# Patient Record
Sex: Female | Born: 1980 | Race: White | Hispanic: No | Marital: Single | Smoking: Current every day smoker
Health system: Southern US, Community
[De-identification: ages and names within clinical notes are randomized; demographics above are authoritative.]

---

## 2004-10-22 ENCOUNTER — Emergency Department (HOSPITAL_COMMUNITY): Admission: EM | Admit: 2004-10-22 | Discharge: 2004-10-22 | Payer: Self-pay | Admitting: Emergency Medicine

## 2012-11-25 ENCOUNTER — Emergency Department (HOSPITAL_COMMUNITY)
Admission: EM | Admit: 2012-11-25 | Discharge: 2012-11-25 | Disposition: A | Discharge status: Home / Self Care | Payer: Self-pay | Attending: Emergency Medicine | Admitting: Emergency Medicine

## 2012-11-25 ENCOUNTER — Encounter (HOSPITAL_COMMUNITY): Payer: Self-pay | Admitting: *Deleted

## 2012-11-25 DIAGNOSIS — Z3202 Encounter for pregnancy test, result negative: Secondary | ICD-10-CM | POA: Insufficient documentation

## 2012-11-25 DIAGNOSIS — F17200 Nicotine dependence, unspecified, uncomplicated: Secondary | ICD-10-CM | POA: Insufficient documentation

## 2012-11-25 DIAGNOSIS — R1084 Generalized abdominal pain: Secondary | ICD-10-CM | POA: Insufficient documentation

## 2012-11-25 DIAGNOSIS — R109 Unspecified abdominal pain: Secondary | ICD-10-CM

## 2012-11-25 DIAGNOSIS — K2970 Gastritis, unspecified, without bleeding: Secondary | ICD-10-CM | POA: Insufficient documentation

## 2012-11-25 LAB — CBC WITH DIFFERENTIAL/PLATELET
Eosinophils Absolute: 0.1 10*3/uL (ref 0.0–0.7)
Hemoglobin: 14.8 g/dL (ref 12.0–15.0)
Lymphs Abs: 3.2 10*3/uL (ref 0.7–4.0)
MCH: 31 pg (ref 26.0–34.0)
Monocytes Relative: 6 % (ref 3–12)
Neutro Abs: 8.6 10*3/uL — ABNORMAL HIGH (ref 1.7–7.7)
Neutrophils Relative %: 68 % (ref 43–77)
RBC: 4.78 MIL/uL (ref 3.87–5.11)

## 2012-11-25 LAB — URINE MICROSCOPIC-ADD ON

## 2012-11-25 LAB — URINALYSIS, ROUTINE W REFLEX MICROSCOPIC
Glucose, UA: NEGATIVE mg/dL
Hgb urine dipstick: NEGATIVE
Protein, ur: NEGATIVE mg/dL
Specific Gravity, Urine: 1.024 (ref 1.005–1.030)
pH: 5.5 (ref 5.0–8.0)

## 2012-11-25 LAB — COMPREHENSIVE METABOLIC PANEL
ALT: 27 U/L (ref 0–35)
AST: 20 U/L (ref 0–37)
BUN: 13 mg/dL (ref 6–23)
Chloride: 102 mEq/L (ref 96–112)
GFR calc non Af Amer: >90 mL/min (ref >90)
Glucose, Bld: 122 mg/dL — ABNORMAL HIGH (ref 70–99)
Potassium: 3.7 mEq/L (ref 3.5–5.1)

## 2012-11-25 LAB — POCT PREGNANCY, URINE: Preg Test, Ur: NEGATIVE

## 2012-11-25 MED ORDER — RANITIDINE HCL 150 MG PO TABS: 150.0000 mg | ORAL_TABLET | Freq: Two times a day (BID) | ORAL | Status: AC

## 2012-11-25 MED ORDER — PANTOPRAZOLE SODIUM 40 MG PO TBEC
40.0000 mg | DELAYED_RELEASE_TABLET | Freq: Once | ORAL | Status: AC
  Administered 2012-11-25: 40 mg via ORAL
  Filled 2012-11-25: qty 1

## 2012-11-25 MED ORDER — GI COCKTAIL ~~LOC~~
30.0000 mL | Freq: Once | ORAL | Status: AC
  Administered 2012-11-25: 30 mL via ORAL
  Filled 2012-11-25: qty 30

## 2012-11-25 NOTE — ED Provider Notes (Signed)
I saw and evaluated the patient, reviewed the resident's note and I agree with the findings and plan.  This 32 year old female has 2 months of daily upper abdominal pains migratory sometimes right upper quadrant sometimes left upper quadrant sometimes epigastric sometimes the entire upper abdomen and never in the lower abdomen with no vomiting no diarrhea no other associated symptoms and no relationship to exertion activity or meals with a soft nontender abdomen today. Her pains can last 24 hours a day or several hours at a time and she has tried no over-the-counter antacids or any other treatment for this the last 2 months.  Hurman Horn, MD 11/28/12 [id number]

## 2012-11-25 NOTE — ED Provider Notes (Signed)
History     CSN: [id number]  Arrival date & time 11/25/12  1522   First MD Initiated Contact with Patient 11/25/12 1549      Chief Complaint  Patient presents with  . Abdominal Cramping    (Consider location/radiation/quality/duration/timing/severity/associated sxs/prior treatment) Patient is a 32 y.o. female presenting with cramps. The history is provided by the patient.  Abdominal Cramping This is a new problem. The current episode started more than 1 month ago (2 months ago). The problem occurs intermittently. The problem has been unchanged. Associated symptoms include abdominal pain (generalized upper abdominal cramping). Pertinent negatives include no anorexia, chest pain, chills, coughing, fever, nausea, neck pain, numbness, rash, urinary symptoms or vomiting. She has tried nothing for the symptoms.    History reviewed. No pertinent past medical history.  History reviewed. No pertinent past surgical history.  No family history on file.  History  Substance Use Topics  . Smoking status: Current Every Day Smoker -- 1.00 packs/day    Types: Cigarettes  . Smokeless tobacco: Not on file  . Alcohol Use: Yes     Comment: occasionally    OB History   Grav Para Term Preterm Abortions TAB SAB Ect Mult Living                  Review of Systems  Constitutional: Negative for fever, chills, activity change and appetite change.  HENT: Negative for neck pain and neck stiffness.   Respiratory: Negative for cough, chest tightness, shortness of breath and wheezing.   Cardiovascular: Negative for chest pain and palpitations.  Gastrointestinal: Positive for abdominal pain (generalized upper abdominal cramping). Negative for nausea, vomiting, diarrhea, constipation, blood in stool, abdominal distention, anal bleeding, rectal pain and anorexia.  Genitourinary: Negative for dysuria, frequency, hematuria, flank pain, decreased urine volume, vaginal bleeding, vaginal discharge, difficulty  urinating, vaginal pain, menstrual problem and pelvic pain.  Skin: Negative for rash and wound.  Neurological: Negative for seizures, syncope, facial asymmetry, light-headedness and numbness.  Psychiatric/Behavioral: Negative for confusion and agitation.  All other systems reviewed and are negative.    Allergies  Review of patient's allergies indicates no known allergies.  Home Medications  No current outpatient prescriptions on file.  BP 158/100  Pulse 103  Temp(Src) 98 F (36.7 C) (Oral)  Resp 19  SpO2 97%  LMP 08/14/2012  Physical Exam  Nursing note and vitals reviewed. Constitutional: She is oriented to person, place, and time. She appears well-developed and well-nourished.  HENT:  Head: Normocephalic and atraumatic.  Right Ear: External ear normal.  Left Ear: External ear normal.  Nose: Nose normal.  Mouth/Throat: Oropharynx is clear and moist. No oropharyngeal exudate.  Eyes: Conjunctivae are normal. Pupils are equal, round, and reactive to light.  Neck: Normal range of motion. Neck supple.  Cardiovascular: Normal rate, regular rhythm, normal heart sounds and intact distal pulses.  Exam reveals no gallop and no friction rub.   No murmur heard. Pulmonary/Chest: Effort normal and breath sounds normal. No respiratory distress. She has no wheezes. She has no rales. She exhibits no tenderness.  Abdominal: Soft. Bowel sounds are normal. She exhibits no distension and no mass. There is no tenderness. There is no rebound and no guarding.  Musculoskeletal: Normal range of motion. She exhibits no edema and no tenderness.  Neurological: She is alert and oriented to person, place, and time.  Skin: Skin is warm and dry.  Psychiatric: She has a normal mood and affect. Her behavior is normal. Judgment and thought  content normal.    ED Course  Procedures (including critical care time)  Labs Reviewed  CBC WITH DIFFERENTIAL - Abnormal; Notable for the following:    WBC 12.7 (*)     Neutro Abs 8.6 (*)    All other components within normal limits  COMPREHENSIVE METABOLIC PANEL - Abnormal; Notable for the following:    Glucose, Bld 122 (*)    All other components within normal limits  URINALYSIS, ROUTINE W REFLEX MICROSCOPIC - Abnormal; Notable for the following:    APPearance CLOUDY (*)    Leukocytes, UA SMALL (*)    All other components within normal limits  URINE MICROSCOPIC-ADD ON - Abnormal; Notable for the following:    Squamous Epithelial / LPF MANY (*)    All other components within normal limits  LIPASE, BLOOD  POCT PREGNANCY, URINE   No results found.   1. Abdominal cramping   2. Gastritis       MDM  32 yo F presents for 2 months of waxing and waning upper bilateral abdominal cramping without associated nausea/vomiting, fever/chills, or changes in bowel movements. Triage note mentions "menstrual cramping," and pt states the quality of pain is similar to menstrual cramping; however, all symptoms have been above umbilicus. No pain below umbilicus. AFVSS. No abdominal tenderness; no flank tenderness. Clinical picture not concerning for acute appendicitis, ovarian torsion, AAA, nephrolithiasis, or ectopic pregnancy. U/A not c/w UTI. Urine hcG negative. Clinical picture not concerning for acute cholecystitis, acute hepatitis, perforated viscus, or acute pancreatitis. Pain resolved with GI cocktail and Zantac. Pt counseled to obtain a PCP (and instructions on how to obtain a PCP) and provided with prescription for Zantac.         Clemetine Marker, MD 11/25/12 1901

## 2012-11-25 NOTE — ED Notes (Addendum)
Pt last LMP in Jan.  States she has not been having menstrual bleeding, but continues to feel menstrual cramping that is not relieved by otc meds.  Pt has taken 3 pregnancy tests in last 3 months that were all negative.  Denies vaginal discharge or urinary/bowel s/s.

## 2015-05-17 ENCOUNTER — Encounter (HOSPITAL_COMMUNITY): Payer: Self-pay | Admitting: Emergency Medicine

## 2015-05-17 ENCOUNTER — Emergency Department (HOSPITAL_COMMUNITY)
Admission: EM | Admit: 2015-05-17 | Discharge: 2015-05-17 | Disposition: A | Discharge status: Home / Self Care | Payer: BC Managed Care – PPO | Attending: Emergency Medicine | Admitting: Emergency Medicine

## 2015-05-17 DIAGNOSIS — Z79899 Other long term (current) drug therapy: Secondary | ICD-10-CM | POA: Diagnosis not present

## 2015-05-17 DIAGNOSIS — L02211 Cutaneous abscess of abdominal wall: Secondary | ICD-10-CM

## 2015-05-17 DIAGNOSIS — Z720 Tobacco use: Secondary | ICD-10-CM | POA: Insufficient documentation

## 2015-05-17 MED ORDER — OXYCODONE-ACETAMINOPHEN 5-325 MG PO TABS
1.0000 | ORAL_TABLET | Freq: Once | ORAL | Status: AC
  Administered 2015-05-17: 1 via ORAL
  Filled 2015-05-17: qty 1

## 2015-05-17 MED ORDER — SULFAMETHOXAZOLE-TRIMETHOPRIM 800-160 MG PO TABS
1.0000 | ORAL_TABLET | Freq: Once | ORAL | Status: AC
  Administered 2015-05-17: 1 via ORAL
  Filled 2015-05-17: qty 1

## 2015-05-17 MED ORDER — OXYCODONE-ACETAMINOPHEN 5-325 MG PO TABS
1.0000 | ORAL_TABLET | ORAL | Status: AC | PRN
Start: 2015-05-17 — End: ?

## 2015-05-17 MED ORDER — SULFAMETHOXAZOLE-TRIMETHOPRIM 800-160 MG PO TABS: 1.0000 | ORAL_TABLET | Freq: Two times a day (BID) | ORAL | Status: AC

## 2015-05-17 MED ORDER — LIDOCAINE-EPINEPHRINE (PF) 2 %-1:200000 IJ SOLN
20.0000 mL | Freq: Once | INTRAMUSCULAR | Status: AC
  Administered 2015-05-17: 20 mL via INTRADERMAL
  Filled 2015-05-17: qty 20

## 2015-05-17 NOTE — ED Notes (Signed)
Pt c/o large abscess on lower abd x's 4 days .

## 2015-05-17 NOTE — ED Provider Notes (Signed)
CSN: [id number]     Arrival date & time 05/17/15  1919 History   First MD Initiated Contact with Patient 05/17/15 2115     Chief Complaint  Patient presents with  . Abscess     (Consider location/radiation/quality/duration/timing/severity/associated sxs/prior Treatment) Patient is a 34 y.o. female presenting with abscess. The history is provided by the patient. No language interpreter was used.  Abscess Location:  Torso Abscess quality: draining   Red streaking: yes   Duration:  4 days Progression:  Worsening Associated symptoms: no fever, no nausea and no vomiting   Associated symptoms comment:  Complains of large area of redness surrounding abscess on pannus. No fever, vomiting. No h/o DM or recurrent abscesses.    History reviewed. No pertinent past medical history. History reviewed. No pertinent past surgical history. No family history on file. Social History  Substance Use Topics  . Smoking status: Current Every Day Smoker -- 1.00 packs/day    Types: Cigarettes  . Smokeless tobacco: None  . Alcohol Use: Yes     Comment: occasionally   OB History    No data available     Review of Systems  Constitutional: Negative for fever and chills.  Gastrointestinal: Negative.  Negative for nausea and vomiting.  Musculoskeletal: Negative.  Negative for myalgias.  Skin:       C/O Abscess.  Neurological: Negative.       Allergies  Review of patient's allergies indicates no known allergies.  Home Medications   Prior to Admission medications   Medication Sig Start Date End Date Taking? Authorizing Provider  ranitidine (ZANTAC) 150 MG tablet Take 1 tablet (150 mg total) by mouth 2 (two) times daily. 11/25/12   Clemetine Marker, MD   BP 117/74 mmHg  Pulse 111  Temp(Src) 98.6 F (37 C) (Oral)  Resp 20  Wt 268 lb (121.564 kg)  SpO2 98%  LMP 04/14/2015 Physical Exam  Constitutional: She is oriented to person, place, and time. She appears well-developed and well-nourished.   Neck: Normal range of motion.  Pulmonary/Chest: Effort normal.  Abdominal: Soft. There is tenderness.  Morbidly obese.   Neurological: She is alert and oriented to person, place, and time.  Skin: Skin is warm and dry.  Large boil to right lower abdominal wall/pannus with large area surrounding redness. No active drainage.     ED Course  Procedures (including critical care time) Labs Review Labs Reviewed - No data to display  Imaging Review No results found. I have personally reviewed and evaluated these images and lab results as part of my medical decision-making.   EKG Interpretation None     INCISION AND DRAINAGE Performed by: Elpidio Anis A Consent: Verbal consent obtained. Risks and benefits: risks, benefits and alternatives were discussed Type: abscess  Body area: lower right abdominal wall  Anesthesia: local infiltration  Incision was made with a scalpel.  Local anesthetic: lidocaine 2% w/ epinephrine  Anesthetic total: 3 ml  Complexity: complex Blunt dissection to break up loculations  Drainage: purulent  Drainage amount: moderate, purulent  Packing material: none  Patient tolerance: Patient tolerated the procedure well with no immediate complications.    MDM   Final diagnoses:  None    1. Abdominal wall abscess  Patient is started on antibiotics for cellulitis associated with abscess. She is afebrile, appears well and is in NAD. Discharged home with 2 day recheck instructions.    Elpidio Anis, PA-C 05/20/15 2240  Margarita Grizzle, MD 05/28/15 1019

## 2015-05-17 NOTE — Discharge Instructions (Signed)

## 2020-09-25 ENCOUNTER — Emergency Department (HOSPITAL_COMMUNITY): Payer: BC Managed Care – PPO

## 2020-09-25 ENCOUNTER — Other Ambulatory Visit: Payer: Self-pay

## 2020-09-25 ENCOUNTER — Emergency Department (HOSPITAL_COMMUNITY)
Admission: EM | Admit: 2020-09-25 | Discharge: 2020-09-26 | Disposition: A | Discharge status: Home / Self Care | Payer: BC Managed Care – PPO | Attending: Emergency Medicine | Admitting: Emergency Medicine

## 2020-09-25 DIAGNOSIS — Z23 Encounter for immunization: Secondary | ICD-10-CM | POA: Insufficient documentation

## 2020-09-25 DIAGNOSIS — S6991XA Unspecified injury of right wrist, hand and finger(s), initial encounter: Secondary | ICD-10-CM | POA: Diagnosis present

## 2020-09-25 DIAGNOSIS — S61411A Laceration without foreign body of right hand, initial encounter: Secondary | ICD-10-CM | POA: Diagnosis not present

## 2020-09-25 DIAGNOSIS — F17210 Nicotine dependence, cigarettes, uncomplicated: Secondary | ICD-10-CM | POA: Insufficient documentation

## 2020-09-25 DIAGNOSIS — W268XXA Contact with other sharp object(s), not elsewhere classified, initial encounter: Secondary | ICD-10-CM | POA: Diagnosis not present

## 2020-09-25 MED ORDER — CEPHALEXIN 500 MG PO CAPS: 500.0000 mg | ORAL_CAPSULE | Freq: Four times a day (QID) | ORAL | 0 refills | Status: AC

## 2020-09-25 MED ORDER — TETANUS-DIPHTH-ACELL PERTUSSIS 5-2.5-18.5 LF-MCG/0.5 IM SUSY
0.5000 mL | PREFILLED_SYRINGE | Freq: Once | INTRAMUSCULAR | Status: AC
  Administered 2020-09-25: 0.5 mL via INTRAMUSCULAR
  Filled 2020-09-25: qty 0.5

## 2020-09-25 MED ORDER — LIDOCAINE-EPINEPHRINE 2 %-1:100000 IJ SOLN
10.0000 mL | Freq: Once | INTRAMUSCULAR | Status: AC
  Administered 2020-09-25: 10 mL via INTRADERMAL
  Filled 2020-09-25: qty 10

## 2020-09-25 NOTE — ED Provider Notes (Signed)
[facility] EMERGENCY DEPARTMENT Provider Note   CSN: [id number] Arrival date & time: 09/25/20  1925     History Chief Complaint  Patient presents with  . Extremity Laceration    Olivia Baldwin is a 41 y.o. female with no significant history who presents to the ED for R hand laceration. Cut hand from metal part of a mop approximately 1 hour prior to arrival. Bleeding controlled with direct pressure. Patient R handed. Denies numbness or difficulty moving hand. No other injuries reported. Tetanus not up to date.  The history is provided by the patient and medical records.  Laceration Location:  Hand Hand laceration location:  R palm Length:  4 Depth:  Cutaneous Quality: jagged   Bleeding: controlled   Time since incident:  1 hour Laceration mechanism:  Metal edge Pain details:    Severity:  No pain Foreign body present:  No foreign bodies Relieved by:  Pressure Worsened by:  Nothing Ineffective treatments:  None tried Tetanus status:  Out of date Associated symptoms: no fever, no focal weakness, no numbness, no rash and no swelling        No past medical history on file.  There are no problems to display for this patient.   No past surgical history on file.   OB History   No obstetric history on file.     No family history on file.  Social History   Tobacco Use  . Smoking status: Current Every Day Smoker    Packs/day: 1.00    Types: Cigarettes  Substance Use Topics  . Alcohol use: Yes    Comment: occasionally  . Drug use: No    Home Medications Prior to Admission medications   Medication Sig Start Date End Date Taking? Authorizing Provider  cephALEXin (KEFLEX) 500 MG capsule Take 1 capsule (500 mg total) by mouth 4 (four) times daily for 7 days. 09/25/20 10/02/20 Yes Tonia Brooms, MD  oxyCODONE-acetaminophen (PERCOCET/ROXICET) 5-325 MG tablet Take 1-2 tablets by mouth every 4 (four) hours as needed for severe pain. 05/17/15   Elpidio Anis, PA-C  ranitidine (ZANTAC) 150 MG tablet Take 1 tablet (150 mg total) by mouth 2 (two) times daily. 11/25/12   Clemetine Marker, MD    Allergies    Patient has no known allergies.  Review of Systems   Review of Systems  Constitutional: Negative for chills and fever.  HENT: Negative for ear pain and sore throat.   Eyes: Negative for pain and visual disturbance.  Respiratory: Negative for cough and shortness of breath.   Cardiovascular: Negative for chest pain and palpitations.  Gastrointestinal: Negative for abdominal pain and vomiting.  Genitourinary: Negative for dysuria and hematuria.  Musculoskeletal: Negative for arthralgias and back pain.  Skin: Positive for wound. Negative for color change and rash.  Neurological: Negative for focal weakness, seizures and syncope.  All other systems reviewed and are negative.   Physical Exam Updated Vital Signs BP (!) 159/97 (BP Location: Left Arm)   Pulse (!) 106   Temp 98.7 F (37.1 C) (Oral)   Resp 18   SpO2 98%   Physical Exam Vitals and nursing note reviewed.  Constitutional:      General: She is awake. She is not in acute distress.    Appearance: Normal appearance. She is well-developed and well-nourished. She is obese. She is not ill-appearing.  HENT:     Head: Normocephalic and atraumatic.     Right Ear: External ear normal.  Left Ear: External ear normal.  Eyes:     General: No scleral icterus.       Right eye: No discharge.        Left eye: No discharge.     Conjunctiva/sclera: Conjunctivae normal.  Cardiovascular:     Rate and Rhythm: Normal rate and regular rhythm.  Pulmonary:     Effort: Pulmonary effort is normal. No respiratory distress.  Musculoskeletal:        General: Signs of injury present. No edema.     Cervical back: Neck supple.  Skin:    General: Skin is warm and dry.     Findings: No rash.     Comments: 4cm curvilinear hemostatic laceration to R palm. R hand NVI with ROM of digits and wrist  preserved.  Neurological:     General: No focal deficit present.     Mental Status: She is alert and oriented to person, place, and time.  Psychiatric:        Mood and Affect: Mood and affect and mood normal.        Behavior: Behavior normal. Behavior is cooperative.     ED Results / Procedures / Treatments   Labs (all labs ordered are listed, but only abnormal results are displayed) Labs Reviewed - No data to display  EKG None  Radiology DG Hand Complete Right  Result Date: 09/25/2020 CLINICAL DATA:  Right palm laceration.  Assess for foreign body. EXAM: RIGHT HAND - COMPLETE 3+ VIEW COMPARISON:  None. FINDINGS: There is no evidence of fracture or dislocation. There is no evidence of arthropathy. Tiny cortical excrescence from the fifth metacarpal is chronic. No site of laceration is not well-defined by radiograph. There is no radiopaque foreign body. IMPRESSION: No radiopaque foreign body or acute osseous abnormality. Electronically Signed   By: Narda Rutherford M.D.   On: 09/25/2020 21:46    Procedures .Marland KitchenLaceration Repair  Date/Time: 09/25/2020 9:20 PM Performed by: Tonia Brooms, MD Authorized by: Virgina Norfolk, DO   Consent:    Consent obtained:  Verbal   Consent given by:  Patient   Risks, benefits, and alternatives were discussed: yes     Risks discussed:  Infection, nerve damage, poor cosmetic result, poor wound healing, pain, tendon damage and vascular damage   Alternatives discussed:  No treatment Universal protocol:    Procedure explained and questions answered to patient or proxy's satisfaction: yes     Site/side marked: yes     Immediately prior to procedure, a time out was called: yes     Patient identity confirmed:  Verbally with patient and arm band Anesthesia:    Anesthesia method:  Local infiltration   Local anesthetic:  Lidocaine 2% WITH epi Laceration details:    Location:  Hand   Hand location:  R palm   Length (cm):  4   Depth (mm):   3 Pre-procedure details:    Preparation:  Patient was prepped and draped in usual sterile fashion and imaging obtained to evaluate for foreign bodies Exploration:    Limited defect created (wound extended): no     Hemostasis achieved with:  Direct pressure   Imaging obtained: x-ray     Imaging outcome: foreign body not noted     Wound exploration: wound explored through full range of motion and entire depth of wound visualized     Contaminated: no   Treatment:    Area cleansed with:  Saline   Amount of cleaning:  Standard   Irrigation solution:  Sterile saline   Irrigation volume:  250cc   Irrigation method:  Syringe   Visualized foreign bodies/material removed: no     Debridement:  Minimal   Undermining:  None   Scar revision: no   Skin repair:    Repair method:  Sutures   Suture size:  5-0   Wound skin closure material used: Ethilon.   Suture technique:  Simple interrupted   Number of sutures:  7 Approximation:    Approximation:  Close Repair type:    Repair type:  Simple Post-procedure details:    Dressing:  Antibiotic ointment and sterile dressing   Procedure completion:  Tolerated well, no immediate complications    Medications Ordered in ED Medications  Tdap (BOOSTRIX) injection 0.5 mL (0.5 mLs Intramuscular Given 09/25/20 2215)  lidocaine-EPINEPHrine (XYLOCAINE W/EPI) 2 %-1:100000 (with pres) injection 10 mL (10 mLs Intradermal Given 09/25/20 2216)    ED Course  I have reviewed the triage vital signs and the nursing notes.  Pertinent labs & imaging results that were available during my care of the patient were reviewed by me and considered in my medical decision making (see chart for details).    MDM Rules/Calculators/A&P                          Patient is a 39yoF with history and physical as described above who presents to the ED for R hand laceration. Bleeding controlled. R hand NVI intact, able to make a fist, and ROM of R hand and wrist preserved. Patient  not complaining of any pain. Initial workup includes plain films to assess for retained FB.  Plain films unremarkable. Wound irrigated, explored, and repaired at bedside successfully without immediate complication. Will prescribe patient Keflex ppx. Instructed patient to follow up in 7-10 days to have sutures removed. Tetanus updated in ED. HPI and physical exam not consistent with neurovascular injury, FB, fracture, or dislocation. Patient otherwise HDS and appropriate for discharge.  Strict return precautions provided and discussed. Questions and concerns addressed. Patient verbalized understanding and amenable with discharge plan. Patient in stable condition at time of discharge.  Final Clinical Impression(s) / ED Diagnoses Final diagnoses:  Laceration of right hand without foreign body, initial encounter    Rx / DC Orders ED Discharge Orders         Ordered    cephALEXin (KEFLEX) 500 MG capsule  4 times daily        09/25/20 2340           Tonia Brooms, MD 09/25/20 2344    Virgina Norfolk, DO 09/26/20 0018

## 2020-09-25 NOTE — ED Provider Notes (Signed)
Medical screening examination/treatment/procedure(s) were performed by non-physician practitioner and as supervising physician I was immediately available for consultation/collaboration.  Patient with laceration to the right palm.  From metal bite.  Hemostatic.  Resident to clean and debride the wound and repair.  Will discharge on antibiotics.  Neurovascular neurovascularly intact.  This chart was dictated using voice recognition software.  Despite best efforts to proofread,  errors can occur which can change the documentation meaning.     EKG Interpretation None           Virgina Norfolk, DO 09/25/20 2233

## 2020-09-25 NOTE — ED Triage Notes (Signed)
Pt presents to ED POV. Pt c/o injury to R palm of hand. Pt reports that she was mopping and metal part cut hand. Bleeding still active but slowed. Not UTD on tetanus

## 2020-09-25 NOTE — Discharge Instructions (Signed)
Please follow up with Urgent Care, your PCP, or ED to have sutures removed in 7-10 days. Please keep wound dry for the next 24 hours.

## 2021-06-24 IMAGING — CR DG HAND COMPLETE 3+V*R*
3 series · 3 of 3 positions shown · non-contrast
Comparison: None.

CLINICAL DATA: Right palm laceration.  Assess for foreign body.

EXAM:
RIGHT HAND - COMPLETE 3+ VIEW

[hand pa]
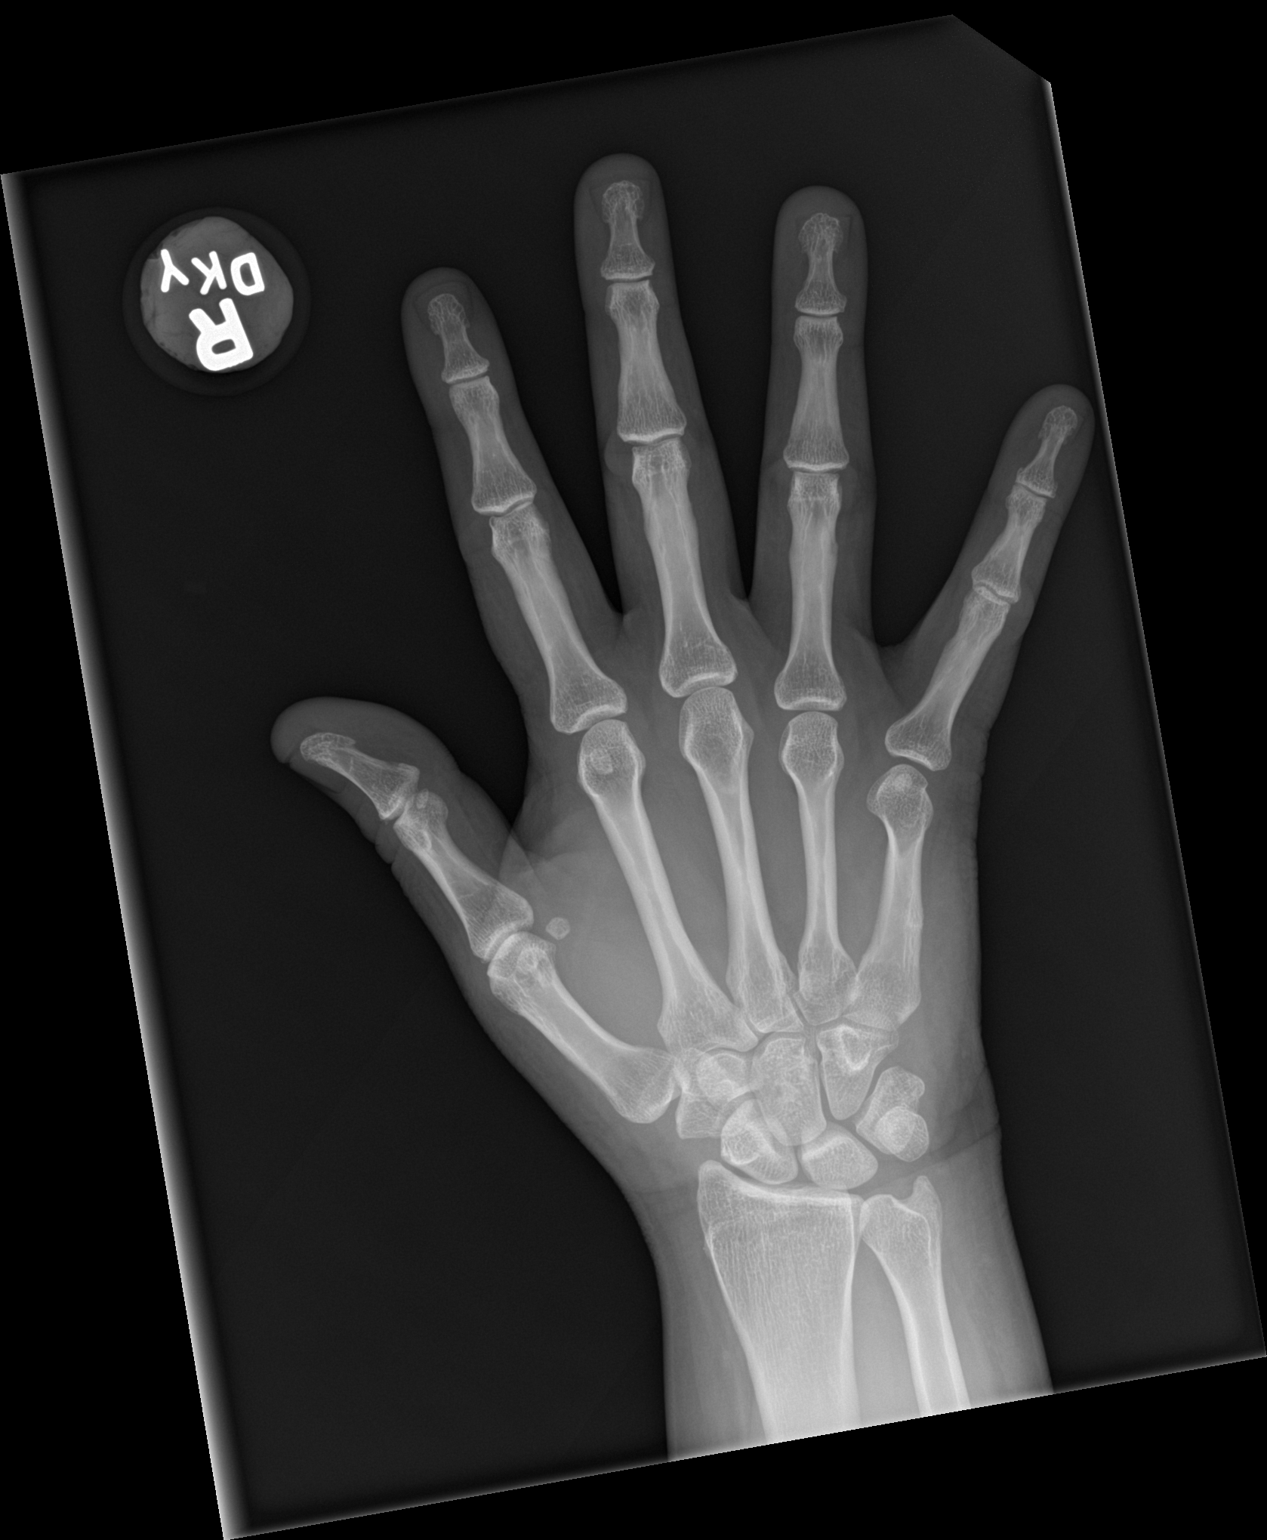

[hand obl]
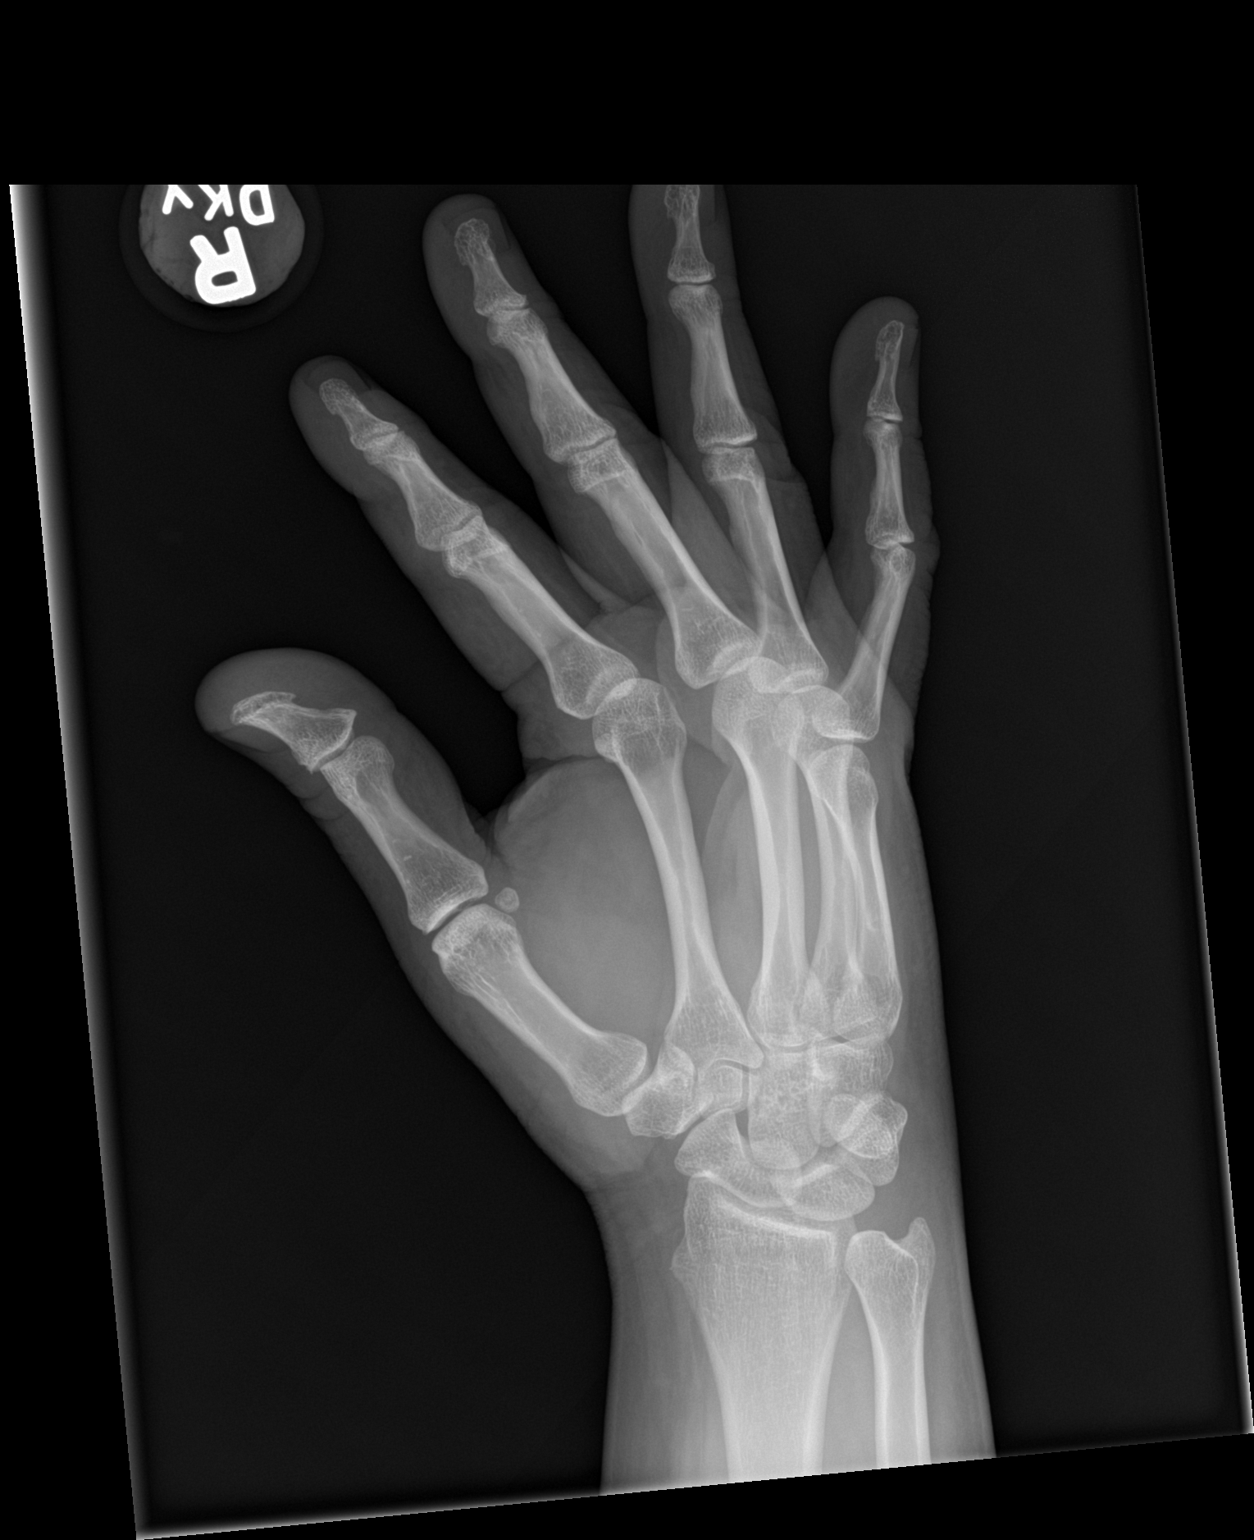

[hand lat]
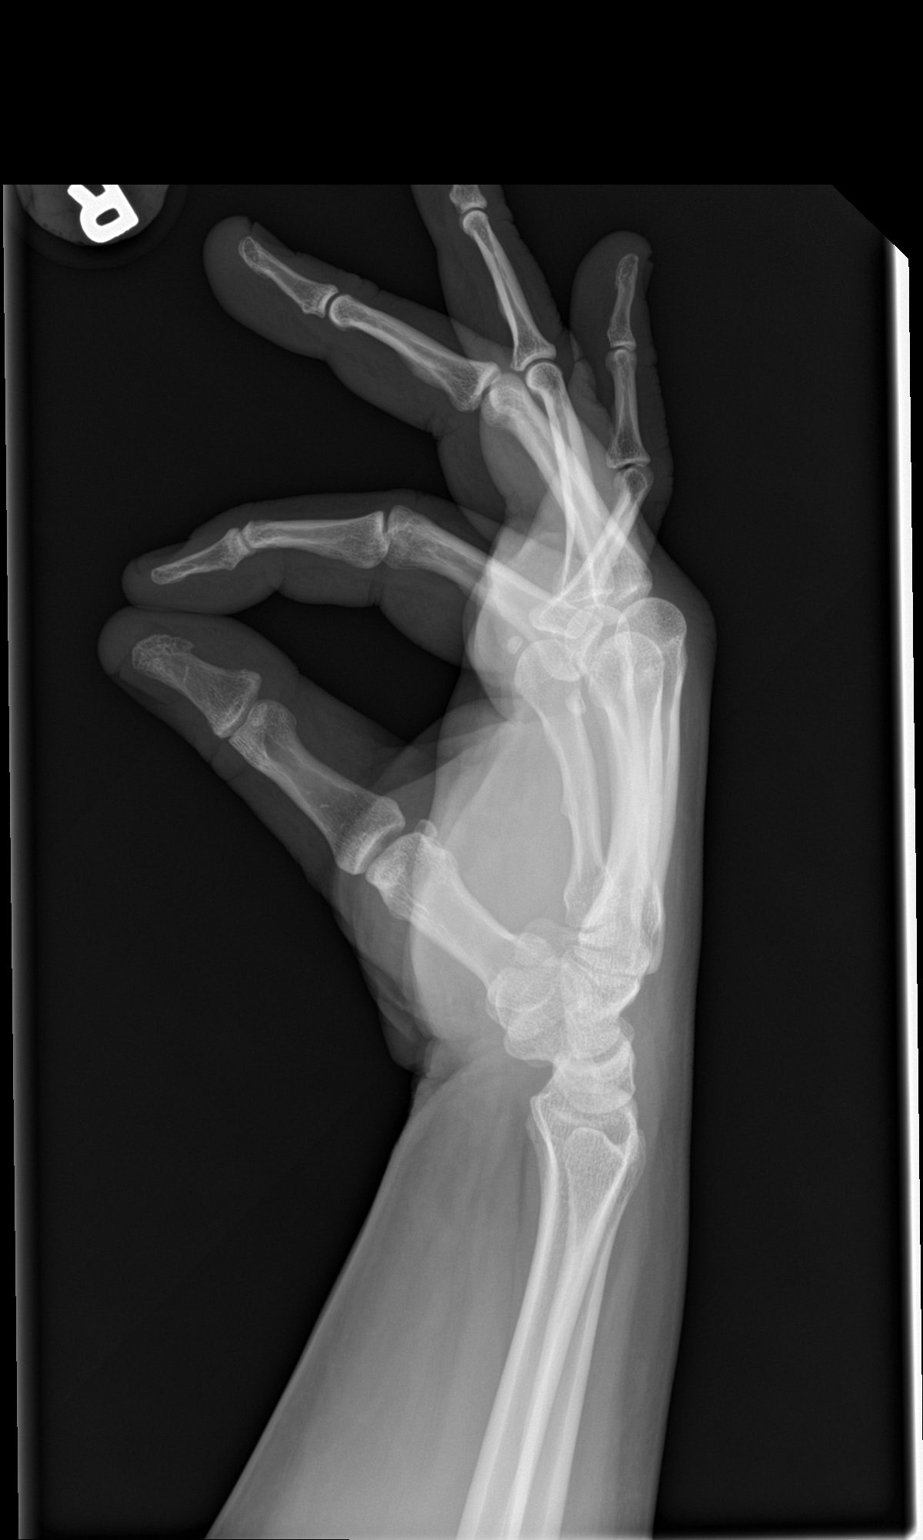

[3 of 3 positions shown; findings below may reference images not displayed]

FINDINGS: There is no evidence of fracture or dislocation. There is no
evidence of arthropathy. Tiny cortical excrescence from the fifth
metacarpal is chronic. No site of laceration is not well-defined by
radiograph. There is no radiopaque foreign body.
IMPRESSION: No radiopaque foreign body or acute osseous abnormality.
